# Patient Record
Sex: Female | Born: 1970 | Race: White | Hispanic: No | Marital: Single | Smoking: Never smoker
Health system: Southern US, Community
[De-identification: ages and names within clinical notes are randomized; demographics above are authoritative.]

## PROBLEM LIST (undated history)

## (undated) DIAGNOSIS — E119 Type 2 diabetes mellitus without complications: Secondary | ICD-10-CM

## (undated) HISTORY — PX: CHOLECYSTECTOMY: SHX55

## (undated) HISTORY — PX: OVARIAN CYST SURGERY: SHX726

---

## 1998-09-22 ENCOUNTER — Encounter: Payer: Self-pay | Admitting: *Deleted

## 1998-09-22 ENCOUNTER — Inpatient Hospital Stay (HOSPITAL_COMMUNITY): Admission: AD | Admit: 1998-09-22 | Discharge: 1998-09-22 | Payer: Self-pay | Admitting: *Deleted

## 2014-06-06 ENCOUNTER — Other Ambulatory Visit: Payer: Self-pay | Admitting: Orthopedic Surgery

## 2014-06-06 ENCOUNTER — Encounter (HOSPITAL_BASED_OUTPATIENT_CLINIC_OR_DEPARTMENT_OTHER): Payer: Self-pay | Admitting: *Deleted

## 2014-06-06 ENCOUNTER — Encounter (HOSPITAL_COMMUNITY): Payer: Self-pay | Admitting: Emergency Medicine

## 2014-06-06 ENCOUNTER — Emergency Department (HOSPITAL_COMMUNITY)
Admission: EM | Admit: 2014-06-06 | Discharge: 2014-06-06 | Disposition: A | Discharge status: Home / Self Care | Payer: BC Managed Care – PPO | Attending: Emergency Medicine | Admitting: Emergency Medicine

## 2014-06-06 ENCOUNTER — Emergency Department (HOSPITAL_COMMUNITY): Payer: BC Managed Care – PPO

## 2014-06-06 DIAGNOSIS — Y929 Unspecified place or not applicable: Secondary | ICD-10-CM | POA: Insufficient documentation

## 2014-06-06 DIAGNOSIS — S6990XA Unspecified injury of unspecified wrist, hand and finger(s), initial encounter: Secondary | ICD-10-CM | POA: Diagnosis present

## 2014-06-06 DIAGNOSIS — S59919A Unspecified injury of unspecified forearm, initial encounter: Secondary | ICD-10-CM

## 2014-06-06 DIAGNOSIS — S52599A Other fractures of lower end of unspecified radius, initial encounter for closed fracture: Secondary | ICD-10-CM | POA: Insufficient documentation

## 2014-06-06 DIAGNOSIS — W010XXA Fall on same level from slipping, tripping and stumbling without subsequent striking against object, initial encounter: Secondary | ICD-10-CM | POA: Diagnosis not present

## 2014-06-06 DIAGNOSIS — S59909A Unspecified injury of unspecified elbow, initial encounter: Secondary | ICD-10-CM | POA: Diagnosis present

## 2014-06-06 DIAGNOSIS — S52502A Unspecified fracture of the lower end of left radius, initial encounter for closed fracture: Secondary | ICD-10-CM

## 2014-06-06 DIAGNOSIS — Y939 Activity, unspecified: Secondary | ICD-10-CM | POA: Diagnosis not present

## 2014-06-06 MED ORDER — OXYCODONE-ACETAMINOPHEN 5-325 MG PO TABS
1.0000 | ORAL_TABLET | Freq: Once | ORAL | Status: AC
  Administered 2014-06-06: 1 via ORAL
  Filled 2014-06-06: qty 1

## 2014-06-06 MED ORDER — IBUPROFEN 600 MG PO TABS: 600.0000 mg | ORAL_TABLET | Freq: Four times a day (QID) | ORAL | Status: DC | PRN

## 2014-06-06 MED ORDER — HYDROCODONE-ACETAMINOPHEN 5-325 MG PO TABS: 1.0000 | ORAL_TABLET | Freq: Four times a day (QID) | ORAL | Status: DC | PRN

## 2014-06-06 MED ORDER — MORPHINE SULFATE 4 MG/ML IJ SOLN: 6.0000 mg | Freq: Once | INTRAMUSCULAR | Status: DC

## 2014-06-06 NOTE — ED Notes (Signed)
Ortho tech to bedside. 

## 2014-06-06 NOTE — ED Notes (Signed)
Pt declines pain meds at this time, instructed to call if pain increases

## 2014-06-06 NOTE — ED Notes (Signed)
Ortho tech at bedside 

## 2014-06-06 NOTE — ED Notes (Signed)
Pt. slipped and fell at home this morning , no LOC / ambulatory , presents with left wrist pain/swelling . Alert and oriented /respirations unlabored .

## 2014-06-06 NOTE — Progress Notes (Signed)
Orthopedic Tech Progress Note Patient Details:  Tamara Hodge November 20, 1970 [id number] Sugartong splint applied to LUE; tolerated well. Arm sling applied for comfort. Ortho Devices Type of Ortho Device: Ace wrap;Arm sling;Sugartong splint Ortho Device/Splint Location: LUE Ortho Device/Splint Interventions: Application   Asia R Thompson 06/06/2014, 9:20 AM

## 2014-06-06 NOTE — Discharge Instructions (Signed)
Keep wrist elevated. Ice several times a day. Ibuprofen for pain. norco for severe pain. Call and set up a follow up apt with Dr. Merlyn Lot in the office for next week. Return if any issues before that.   Wrist Fracture A wrist fracture is a break or crack in one of the bones of your wrist. Your wrist is made up of eight small bones at the palm of your hand (carpal bones) and two long bones that make up your forearm (radius and ulna).  CAUSES   A direct blow to the wrist.  Falling on an outstretched hand.  Trauma, such as a car accident or a fall. RISK FACTORS Risk factors for wrist fracture include:   Participating in contact and high-risk sports, such as skiing, biking, and ice skating.  Taking steroid medicines.  Smoking.  Being female.  Being Caucasian.  Drinking more than three alcoholic beverages per day.  Having low or lowered bone density (osteoporosis or osteopenia).  Age. Older adults have decreased bone density.  Women who have had menopause.  History of previous fractures. SIGNS AND SYMPTOMS Symptoms of wrist fractures include tenderness, bruising, and inflammation. Additionally, the wrist may hang in an odd position or appear deformed.  DIAGNOSIS Diagnosis may include:  Physical exam.  X-ray. TREATMENT Treatment depends on many factors, including the nature and location of the fracture, your age, and your activity level. Treatment for wrist fracture can be nonsurgical or surgical.  Nonsurgical Treatment A plaster cast or splint may be applied to your wrist if the bone is in a good position. If the fracture is not in good position, it may be necessary for your health care provider to realign it before applying a splint or cast. Usually, a cast or splint will be worn for several weeks.  Surgical Treatment Sometimes the position of the bone is so far out of place that surgery is required to apply a device to hold it together as it heals. Depending on the fracture,  there are a number of options for holding the bone in place while it heals, such as a cast and metal pins.  HOME CARE INSTRUCTIONS  Keep your injured wrist elevated and move your fingers as much as possible.  Do not put pressure on any part of your cast or splint. It may break.   Use a plastic bag to protect your cast or splint from water while bathing or showering. Do not lower your cast or splint into water.  Take medicines only as directed by your health care provider.  Keep your cast or splint clean and dry. If it becomes wet, damaged, or suddenly feels too tight, contact your health care provider right away.  Do not use any tobacco products including cigarettes, chewing tobacco, or electronic cigarettes. Tobacco can delay bone healing. If you need help quitting, ask your health care provider.  Keep all follow-up visits as directed by your health care provider. This is important.  Ask your health care provider if you should take supplements of calcium and vitamins C and D to promote bone healing. SEEK MEDICAL CARE IF:   Your cast or splint is damaged, breaks, or gets wet.  You have a fever.  You have chills.  You have continued severe pain or more swelling than you did before the cast was put on. SEEK IMMEDIATE MEDICAL CARE IF:   Your hand or fingernails on the injured arm turn blue or gray, or feel cold or numb.  You have decreased  feeling in the fingers of your injured arm. MAKE SURE YOU:  Understand these instructions.  Will watch your condition.  Will get help right away if you are not doing well or get worse. Document Released: 06/29/2005 Document Revised: 02/03/2014 Document Reviewed: 10/07/2011 [facility] Patient Information 2015 [place], [place]. This information is not intended to replace advice given to you by your health care provider. Make sure you discuss any questions you have with your health care provider.

## 2014-06-06 NOTE — ED Provider Notes (Signed)
CSN: [id number]     Arrival date & time 06/06/14  4098 History   First MD Initiated Contact with Patient 06/06/14 [phone]     Chief Complaint  Patient presents with  . Wrist Injury     (Consider location/radiation/quality/duration/timing/severity/associated sxs/prior Treatment) HPI Tamara Hodge is a 43 y.o. female who presents to ED with complaint of left wrist injury. Pt states she slipped on a tile floor and fell trying to catch herself with left hand. Pt reports pain and deformity to left wrist. No numbness or weakness to the hand. No prior wrist injuries. No tx at home prior to coming in. Denies any other injuries. Did not hit head, no LOC.   History reviewed. No pertinent past medical history. Past Surgical History  Procedure Laterality Date  . Cholecystectomy    . Ovarian cyst surgery     No family history on file. History  Substance Use Topics  . Smoking status: Never Smoker   . Smokeless tobacco: Not on file  . Alcohol Use: Yes   OB History   Grav Para Term Preterm Abortions TAB SAB Ect Mult Living                 Review of Systems  Constitutional: Negative for fever and chills.  Respiratory: Negative for cough, chest tightness and shortness of breath.   Cardiovascular: Negative for chest pain, palpitations and leg swelling.  Musculoskeletal: Positive for arthralgias and joint swelling. Negative for neck pain and neck stiffness.  Skin: Negative for rash.  Neurological: Negative for dizziness, weakness, numbness and headaches.  All other systems reviewed and are negative.     Allergies  Review of patient's allergies indicates no known allergies.  Home Medications   Prior to Admission medications   Not on File   BP 154/87  Pulse 83  Resp 18  Ht  (1.651 m)  Wt 265 lb (120.203 kg)  BMI 44.10 kg/m2  SpO2 98%  LMP 05/28/2014 Physical Exam  Nursing note and vitals reviewed. Constitutional: She is oriented to person, place, and time. She appears  well-developed and well-nourished. No distress.  Eyes: Conjunctivae are normal.  Neck: Neck supple.  Musculoskeletal:  Swelling noted to the radial aspect of left wrist. Tender to palpation over radial wrist. Unable to move wrist due to pain. Normal hand. Normal sensation of all fingers. Able to move all fingers. Cap refill <2 sec distally.  Neurological: She is alert and oriented to person, place, and time.  Skin: Skin is warm and dry.    ED Course  Procedures (including critical care time) Labs Review Labs Reviewed - No data to display  Imaging Review Dg Wrist Complete Left  06/06/2014   CLINICAL DATA:  Fall, wrist injury.  EXAM: LEFT WRIST - COMPLETE 3+ VIEW  COMPARISON:  None.  FINDINGS: There is an intra-articular mildly comminuted fracture through the distal left radius. Minimal displacement. No ulnar fracture visualized. Overlying soft tissue swelling.  IMPRESSION: Comminuted intra-articular distal left radial fracture.   Electronically Signed   By: Charlett Nose M.D.   On: 06/06/2014 07:10     EKG Interpretation None      MDM   Final diagnoses:  Distal radius fracture, left, closed, initial encounter    8:29 AM Pt with distral radial fracture, comminuted, intra articular. Neurovascularly intact.   Spoke with Dr. Merlyn Lot. Advised to splint, follow up with him in the office.   Filed Vitals:   06/06/14 4782 06/06/14 0715 06/06/14 9562  BP: 154/87 113/49 144/61  Pulse: 83 73 84  Resp: Height:  (1.651 m)    Weight: 265 lb (120.203 kg)    SpO2: 98% 99% 97%     Arren Laminack A Trinetta Alemu, PA-C 06/06/14 1250

## 2014-06-06 NOTE — ED Notes (Signed)
Patient transported to X-ray 

## 2014-06-10 ENCOUNTER — Ambulatory Visit (HOSPITAL_BASED_OUTPATIENT_CLINIC_OR_DEPARTMENT_OTHER): Payer: BC Managed Care – PPO | Admitting: Anesthesiology

## 2014-06-10 ENCOUNTER — Encounter (HOSPITAL_BASED_OUTPATIENT_CLINIC_OR_DEPARTMENT_OTHER)
Admission: RE | Disposition: A | Discharge status: Home / Self Care | Payer: Self-pay | Source: Ambulatory Visit | Attending: Orthopedic Surgery

## 2014-06-10 ENCOUNTER — Ambulatory Visit (HOSPITAL_BASED_OUTPATIENT_CLINIC_OR_DEPARTMENT_OTHER)
Admission: RE | Admit: 2014-06-10 | Discharge: 2014-06-10 | Disposition: A | Discharge status: Home / Self Care | Payer: BC Managed Care – PPO | Source: Ambulatory Visit | Attending: Orthopedic Surgery | Admitting: Orthopedic Surgery

## 2014-06-10 ENCOUNTER — Encounter (HOSPITAL_BASED_OUTPATIENT_CLINIC_OR_DEPARTMENT_OTHER): Payer: Self-pay | Admitting: *Deleted

## 2014-06-10 ENCOUNTER — Encounter (HOSPITAL_BASED_OUTPATIENT_CLINIC_OR_DEPARTMENT_OTHER): Payer: BC Managed Care – PPO | Admitting: Anesthesiology

## 2014-06-10 DIAGNOSIS — Y93E1 Activity, personal bathing and showering: Secondary | ICD-10-CM | POA: Insufficient documentation

## 2014-06-10 DIAGNOSIS — Y92009 Unspecified place in unspecified non-institutional (private) residence as the place of occurrence of the external cause: Secondary | ICD-10-CM | POA: Insufficient documentation

## 2014-06-10 DIAGNOSIS — S52599A Other fractures of lower end of unspecified radius, initial encounter for closed fracture: Secondary | ICD-10-CM | POA: Diagnosis not present

## 2014-06-10 DIAGNOSIS — Y998 Other external cause status: Secondary | ICD-10-CM | POA: Diagnosis not present

## 2014-06-10 DIAGNOSIS — W010XXA Fall on same level from slipping, tripping and stumbling without subsequent striking against object, initial encounter: Secondary | ICD-10-CM | POA: Insufficient documentation

## 2014-06-10 HISTORY — PX: OPEN REDUCTION INTERNAL FIXATION (ORIF) DISTAL RADIAL FRACTURE: SHX5989

## 2014-06-10 LAB — POCT HEMOGLOBIN-HEMACUE: Hemoglobin: 13.9 g/dL (ref 12.0–15.0)

## 2014-06-10 SURGERY — OPEN REDUCTION INTERNAL FIXATION (ORIF) DISTAL RADIUS FRACTURE
Anesthesia: Regional | Site: Wrist | Laterality: Left

## 2014-06-10 MED ORDER — BUPIVACAINE-EPINEPHRINE (PF) 0.5% -1:200000 IJ SOLN
INTRAMUSCULAR | Status: DC | PRN
  Administered 2014-06-10: 30 mL via PERINEURAL

## 2014-06-10 MED ORDER — KETOROLAC TROMETHAMINE 30 MG/ML IJ SOLN
INTRAMUSCULAR | Status: AC
  Filled 2014-06-10: qty 1

## 2014-06-10 MED ORDER — KETOROLAC TROMETHAMINE 30 MG/ML IJ SOLN
30.0000 mg | Freq: Once | INTRAMUSCULAR | Status: AC
  Administered 2014-06-10: 30 mg via INTRAVENOUS

## 2014-06-10 MED ORDER — FENTANYL CITRATE 0.05 MG/ML IJ SOLN
INTRAMUSCULAR | Status: AC
  Filled 2014-06-10: qty 2

## 2014-06-10 MED ORDER — FENTANYL CITRATE 0.05 MG/ML IJ SOLN
INTRAMUSCULAR | Status: DC | PRN
  Administered 2014-06-10 (×2): 50 ug via INTRAVENOUS

## 2014-06-10 MED ORDER — CHLORHEXIDINE GLUCONATE 4 % EX LIQD: 60.0000 mL | Freq: Once | CUTANEOUS | Status: DC

## 2014-06-10 MED ORDER — FENTANYL CITRATE 0.05 MG/ML IJ SOLN
50.0000 ug | INTRAMUSCULAR | Status: DC | PRN
  Administered 2014-06-10: 100 ug via INTRAVENOUS

## 2014-06-10 MED ORDER — LACTATED RINGERS IV SOLN
INTRAVENOUS | Status: DC
  Administered 2014-06-10: 13:00:00 via INTRAVENOUS
  Administered 2014-06-10: 10 mL/h via INTRAVENOUS

## 2014-06-10 MED ORDER — PROPOFOL 10 MG/ML IV BOLUS
INTRAVENOUS | Status: DC | PRN
  Administered 2014-06-10: 200 mg via INTRAVENOUS

## 2014-06-10 MED ORDER — MIDAZOLAM HCL 2 MG/2ML IJ SOLN
INTRAMUSCULAR | Status: AC
  Filled 2014-06-10: qty 2

## 2014-06-10 MED ORDER — CEFAZOLIN SODIUM-DEXTROSE 2-3 GM-% IV SOLR
INTRAVENOUS | Status: AC
  Filled 2014-06-10: qty 50

## 2014-06-10 MED ORDER — DEXAMETHASONE SODIUM PHOSPHATE 4 MG/ML IJ SOLN
INTRAMUSCULAR | Status: DC | PRN
  Administered 2014-06-10: 8 mg via INTRAVENOUS

## 2014-06-10 MED ORDER — METOCLOPRAMIDE HCL 5 MG/ML IJ SOLN
INTRAMUSCULAR | Status: DC | PRN
  Administered 2014-06-10: 10 mg via INTRAVENOUS

## 2014-06-10 MED ORDER — DEXTROSE 5 % IV SOLN
3.0000 g | INTRAVENOUS | Status: AC
  Administered 2014-06-10: 3 g via INTRAVENOUS

## 2014-06-10 MED ORDER — MIDAZOLAM HCL 2 MG/2ML IJ SOLN
1.0000 mg | INTRAMUSCULAR | Status: DC | PRN
  Administered 2014-06-10: 2 mg via INTRAVENOUS

## 2014-06-10 MED ORDER — LIDOCAINE HCL (CARDIAC) 20 MG/ML IV SOLN
INTRAVENOUS | Status: DC | PRN
  Administered 2014-06-10: 100 mg via INTRAVENOUS

## 2014-06-10 MED ORDER — FENTANYL CITRATE 0.05 MG/ML IJ SOLN
INTRAMUSCULAR | Status: AC
  Filled 2014-06-10: qty 4

## 2014-06-10 MED ORDER — OXYCODONE-ACETAMINOPHEN 5-325 MG PO TABS: ORAL_TABLET | ORAL | Status: DC

## 2014-06-10 MED ORDER — CEFAZOLIN SODIUM 1-5 GM-% IV SOLN
INTRAVENOUS | Status: AC
  Filled 2014-06-10: qty 50

## 2014-06-10 MED ORDER — MIDAZOLAM HCL 5 MG/5ML IJ SOLN
INTRAMUSCULAR | Status: DC | PRN
  Administered 2014-06-10: 2 mg via INTRAVENOUS

## 2014-06-10 MED ORDER — ONDANSETRON HCL 4 MG/2ML IJ SOLN
INTRAMUSCULAR | Status: DC | PRN
  Administered 2014-06-10: 4 mg via INTRAVENOUS

## 2014-06-10 SURGICAL SUPPLY — 67 items
BANDAGE ELASTIC 3 VELCRO ST LF (GAUZE/BANDAGES/DRESSINGS) ×3 IMPLANT
BIT DRILL 2.0 LNG QUCK RELEASE (BIT) ×1 IMPLANT
BIT DRILL 2.8 QUICK RELEASE (BIT) ×1 IMPLANT
BLADE MINI RND TIP GREEN BEAV (BLADE) IMPLANT
BLADE SURG 15 STRL LF DISP TIS (BLADE) ×2 IMPLANT
BLADE SURG 15 STRL SS (BLADE) ×4
BNDG ESMARK 4X9 LF (GAUZE/BANDAGES/DRESSINGS) ×3 IMPLANT
BNDG GAUZE ELAST 4 BULKY (GAUZE/BANDAGES/DRESSINGS) ×3 IMPLANT
CHLORAPREP W/TINT 26ML (MISCELLANEOUS) ×3 IMPLANT
CORDS BIPOLAR (ELECTRODE) ×3 IMPLANT
COVER MAYO STAND STRL (DRAPES) ×3 IMPLANT
COVER TABLE BACK 60X90 (DRAPES) ×3 IMPLANT
DRAPE EXTREMITY T 121X128X90 (DRAPE) ×3 IMPLANT
DRAPE OEC MINIVIEW 54X84 (DRAPES) ×3 IMPLANT
DRAPE SURG 17X23 STRL (DRAPES) ×3 IMPLANT
DRILL 2.0 LNG QUICK RELEASE (BIT) ×3
DRILL 2.8 QUICK RELEASE (BIT) ×3
GAUZE SPONGE 4X4 12PLY STRL (GAUZE/BANDAGES/DRESSINGS) ×3 IMPLANT
GAUZE XEROFORM 1X8 LF (GAUZE/BANDAGES/DRESSINGS) ×3 IMPLANT
GLOVE BIO SURGEON STRL SZ7.5 (GLOVE) ×3 IMPLANT
GLOVE BIOGEL PI IND STRL 7.0 (GLOVE) ×1 IMPLANT
GLOVE BIOGEL PI IND STRL 8 (GLOVE) ×1 IMPLANT
GLOVE BIOGEL PI IND STRL 8.5 (GLOVE) ×1 IMPLANT
GLOVE BIOGEL PI INDICATOR 7.0 (GLOVE) ×2
GLOVE BIOGEL PI INDICATOR 8 (GLOVE) ×2
GLOVE BIOGEL PI INDICATOR 8.5 (GLOVE) ×2
GLOVE ECLIPSE 6.5 STRL STRAW (GLOVE) ×3 IMPLANT
GLOVE EXAM NITRILE LRG STRL (GLOVE) ×3 IMPLANT
GLOVE SURG ORTHO 8.0 STRL STRW (GLOVE) ×3 IMPLANT
GOWN STRL REUS W/ TWL LRG LVL3 (GOWN DISPOSABLE) ×1 IMPLANT
GOWN STRL REUS W/TWL LRG LVL3 (GOWN DISPOSABLE) ×2
GOWN STRL REUS W/TWL XL LVL3 (GOWN DISPOSABLE) ×6 IMPLANT
GUIDEWIRE ORTHO 0.054X6 (WIRE) ×9 IMPLANT
NEEDLE HYPO 25X1 1.5 SAFETY (NEEDLE) IMPLANT
NS IRRIG 1000ML POUR BTL (IV SOLUTION) ×3 IMPLANT
PACK BASIN DAY SURGERY FS (CUSTOM PROCEDURE TRAY) ×3 IMPLANT
PAD CAST 3X4 CTTN HI CHSV (CAST SUPPLIES) ×1 IMPLANT
PADDING CAST ABS 4INX4YD NS (CAST SUPPLIES)
PADDING CAST ABS COTTON 4X4 ST (CAST SUPPLIES) IMPLANT
PADDING CAST COTTON 3X4 STRL (CAST SUPPLIES) ×2
PLATE ACULOC 2 VDR STD LT (Plate) ×3 IMPLANT
SCREW CORT FT 20X2.3XLCK HEX (Screw) ×1 IMPLANT
SCREW CORT FT 22X2.3XLCK HEX (Screw) ×1 IMPLANT
SCREW CORTICAL LOCKING 2.3X18M (Screw) ×8 IMPLANT
SCREW CORTICAL LOCKING 2.3X20M (Screw) ×4 IMPLANT
SCREW CORTICAL LOCKING 2.3X22M (Screw) ×2 IMPLANT
SCREW FX18X2.3XSMTH LCK NS CRT (Screw) ×4 IMPLANT
SCREW FX20X2.3XSMTH LCK NS CRT (Screw) ×1 IMPLANT
SCREW NON LOCK 3.5X10MM (Screw) ×3 IMPLANT
SCREW NONLOCK HEX 3.5X12 (Screw) ×6 IMPLANT
SLEEVE SCD COMPRESS KNEE MED (MISCELLANEOUS) ×3 IMPLANT
SPLINT PLASTER CAST XFAST 4X15 (CAST SUPPLIES) ×10 IMPLANT
SPLINT PLASTER XTRA FAST SET 4 (CAST SUPPLIES) ×20
STOCKINETTE 4X48 STRL (DRAPES) ×3 IMPLANT
SUCTION FRAZIER TIP 10 FR DISP (SUCTIONS) IMPLANT
SUT ETHILON 3 0 PS 1 (SUTURE) IMPLANT
SUT ETHILON 4 0 PS 2 18 (SUTURE) ×3 IMPLANT
SUT VIC AB 3-0 PS1 18 (SUTURE)
SUT VIC AB 3-0 PS1 18XBRD (SUTURE) IMPLANT
SUT VICRYL 4-0 PS2 18IN ABS (SUTURE) ×3 IMPLANT
SYR BULB 3OZ (MISCELLANEOUS) ×3 IMPLANT
SYR CONTROL 10ML LL (SYRINGE) IMPLANT
TOWEL OR 17X24 6PK STRL BLUE (TOWEL DISPOSABLE) ×3 IMPLANT
TOWEL OR NON WOVEN STRL DISP B (DISPOSABLE) ×3 IMPLANT
TUBE CONNECTING 20'X1/4 (TUBING)
TUBE CONNECTING 20X1/4 (TUBING) IMPLANT
UNDERPAD 30X30 INCONTINENT (UNDERPADS AND DIAPERS) IMPLANT

## 2014-06-10 NOTE — ED Provider Notes (Signed)
Medical screening examination/treatment/procedure(s) were performed by non-physician practitioner and as supervising physician I was immediately available for consultation/collaboration.   EKG Interpretation None       Olivia Mackie, MD 06/10/14 [phone]

## 2014-06-10 NOTE — Brief Op Note (Signed)
06/10/2014  2:49 PM  PATIENT:  Tamara Hodge  43 y.o. female  PRE-OPERATIVE DIAGNOSIS:  left distal radius fracture  POST-OPERATIVE DIAGNOSIS:  left distal radius fracture  PROCEDURE:  Procedure(s): OPEN REDUCTION INTERNAL FIXATION (ORIF) LEFT DISTAL RADIAL FRACTURE (Left)  SURGEON:  Surgeon(s) and Role:    * Betha Loa, MD - Primary    * Cindee Salt, MD - Assisting  PHYSICIAN ASSISTANT:   ASSISTANTS: Cindee Salt, MD   ANESTHESIA:   regional and general  EBL:  Total I/O In: 800 [I.V.:800] Out: -   BLOOD ADMINISTERED:none  DRAINS: none   LOCAL MEDICATIONS USED:  NONE  SPECIMEN:  No Specimen  DISPOSITION OF SPECIMEN:  N/A  COUNTS:  YES  TOURNIQUET:   Total Tourniquet Time Documented: Upper Arm (Left) - 45 minutes Total: Upper Arm (Left) - 45 minutes   DICTATION: .Other Dictation: Dictation Number [id number]  PLAN OF CARE: Discharge to home after PACU  PATIENT DISPOSITION:  PACU - hemodynamically stable.

## 2014-06-10 NOTE — Anesthesia Procedure Notes (Addendum)
Ken930-1[facility]30Marjory Lies [facility] [place] EXTTAG>[id number] TTAG>[facility]156/96/295242mBeverley [phone]14Marjory Lies 6342.5[facility]706/96/295220mBeverley [phone]56Marjory Lies 2342.5[facility]

## 2014-06-10 NOTE — H&P (Signed)
  Tamara Hodge is an 43 y.o. female.   Chief Complaint: left distal radius fracture HPI: 43 yo rhd female states she slid on a plastic bin lid in her bathroom 06/06/14 landing on left hand.  Seen at [facility] where XR revealed left distal radius fracture.  Splinted and followed up in office.  Reports no previous injury to wrist and no other injury at this time.  History reviewed. No pertinent past medical history.  Past Surgical History  Procedure Laterality Date  . Cholecystectomy    . Ovarian cyst surgery      History reviewed. No pertinent family history. Social History:  reports that she has never smoked. She does not have any smokeless tobacco history on file. She reports that she drinks alcohol. She reports that she does not use illicit drugs.  Allergies: No Known Allergies  No prescriptions prior to admission    No results found for this or any previous visit (from the past 48 hour(s)).  No results found.   A comprehensive review of systems was negative.  Height  (1.651 m), weight 120.203 kg (265 lb), last menstrual period 05/28/2014.  General appearance: alert, cooperative and appears stated age Head: Normocephalic, without obvious abnormality, atraumatic Neck: supple, symmetrical, trachea midline Resp: clear to auscultation bilaterally Cardio: regular rate and rhythm GI: non tender Extremities: intact sensation and capillary refill all digits.  +epl/fpl/io.  ttp left wrist.  no wounds.   Pulses: 2+ and symmetric Skin: Skin color, texture, turgor normal. No rashes or lesions Neurologic: Grossly normal Incision/Wound: none  Assessment/Plan Left comminuted intraarticular distal radius fracture.  Non operative and operative treatment options were discussed with the patient and patient wishes to proceed with operative treatment. Recommend OR for ORIF.  Risks, benefits, and alternatives of surgery were discussed and the patient agrees with the plan of  care.   Ami Thornsberry R 06/10/2014, 9:09 AM

## 2014-06-10 NOTE — Anesthesia Preprocedure Evaluation (Signed)
Anesthesia Evaluation  Patient identified by MRN, date of birth, ID band Patient awake    Reviewed: Allergy & Precautions, H&P , NPO status , Patient's Chart, lab work & pertinent test results  Airway Mallampati: II  Neck ROM: full    Dental   Pulmonary neg pulmonary ROS,          Cardiovascular negative cardio ROS      Neuro/Psych    GI/Hepatic   Endo/Other  Morbid obesity  Renal/GU      Musculoskeletal   Abdominal   Peds  Hematology   Anesthesia Other Findings   Reproductive/Obstetrics                           Anesthesia Physical Anesthesia Plan  ASA: II  Anesthesia Plan: General and Regional   Post-op Pain Management:    Induction: Intravenous  Airway Management Planned: LMA  Additional Equipment:   Intra-op Plan:   Post-operative Plan:   Informed Consent: I have reviewed the patients History and Physical, chart, labs and discussed the procedure including the risks, benefits and alternatives for the proposed anesthesia with the patient or authorized representative who has indicated his/her understanding and acceptance.     Plan Discussed with: CRNA, Anesthesiologist and Surgeon  Anesthesia Plan Comments:         Anesthesia Quick Evaluation

## 2014-06-10 NOTE — Anesthesia Postprocedure Evaluation (Signed)
Anesthesia Post Note  Patient: Tamara Hodge  Procedure(s) Performed: Procedure(s) (LRB): OPEN REDUCTION INTERNAL FIXATION (ORIF) LEFT DISTAL RADIAL FRACTURE (Left)  Anesthesia type: General  Patient location: PACU  Post pain: Pain level controlled and Adequate analgesia  Post assessment: Post-op Vital signs reviewed, Patient's Cardiovascular Status Stable, Respiratory Function Stable, Patent Airway and Pain level controlled  Last Vitals:  Filed Vitals:   06/10/14 1600  BP: 124/86  Pulse: 90  Temp:   Resp: 15    Post vital signs: Reviewed and stable  Level of consciousness: awake, alert  and oriented  Complications: No apparent anesthesia complications

## 2014-06-10 NOTE — Progress Notes (Signed)
Assisted Dr. Hodierne with left, ultrasound guided, supraclavicular block. Side rails up, monitors on throughout procedure. See vital signs in flow sheet. Tolerated Procedure well. 

## 2014-06-10 NOTE — Op Note (Signed)
[id number] 

## 2014-06-10 NOTE — Discharge Instructions (Addendum)
[facility] Instructions °Hand Surgery ° °Wound Care: °Keep your hand elevated above the level of your heart.  Do not allow it to dangle by your side.  Keep the dressing dry and do not remove it unless your doctor advises you to do so.  He will usually change it at the time of your post-op visit.  Moving your fingers is advised to stimulate circulation but will depend on the site of your surgery.  If you have a splint applied, your doctor will advise you regarding movement. ° °Activity: °Do not drive or operate machinery today.  Rest today and then you may return to your normal activity and work as indicated by your physician. ° °Diet:  °Drink liquids today or eat a light diet.  You may resume a regular diet tomorrow.   ° °General expectations: °Pain for two to three days. °Fingers may become slightly swollen. ° °Call your doctor if any of the following occur: °Severe pain not relieved by pain medication. °Elevated temperature. °Dressing soaked with blood. °Inability to move fingers. °White or bluish color to fingers. ° ° °Regional Anesthesia Blocks ° °1. Numbness or the inability to move the "blocked" extremity may last from 3-48 hours after placement. The length of time depends on the medication injected and your individual response to the medication. If the numbness is not going away after 48 hours, call your surgeon. ° °2. The extremity that is blocked will need to be protected until the numbness is gone and the  Strength has returned. Because you cannot feel it, you will need to take extra care to avoid injury. Because it may be weak, you may have difficulty moving it or using it. You may not know what position it is in without looking at it while the block is in effect. ° °3. For blocks in the legs and feet, returning to weight bearing and walking needs to be done carefully. You will need to wait until the numbness is entirely gone and the strength has returned. You should be able to move your leg and foot  normally before you try and bear weight or walk. You will need someone to be with you when you first try to ensure you do not fall and possibly risk injury. ° °4. Bruising and tenderness at the needle site are common side effects and will resolve in a few days. ° °5. Persistent numbness or new problems with movement should be communicated to the surgeon or the [facility] ([phone])/ [facility] ([phone]). ° ° °Post Anesthesia Home Care Instructions ° °Activity: °Get plenty of rest for the remainder of the day. A responsible adult should stay with you for 24 hours following the procedure.  °For the next 24 hours, DO NOT: °-Drive a car °-Operate machinery °-Drink alcoholic beverages °-Take any medication unless instructed by your physician °-Make any legal decisions or sign important papers. ° °Meals: °Start with liquid foods such as gelatin or soup. Progress to regular foods as tolerated. Avoid greasy, spicy, heavy foods. If nausea and/or vomiting occur, drink only clear liquids until the nausea and/or vomiting subsides. Call your physician if vomiting continues. ° °Special Instructions/Symptoms: °Your throat may feel dry or sore from the anesthesia or the breathing tube placed in your throat during surgery. If this causes discomfort, gargle with warm salt water. The discomfort should disappear within 24 hours. ° °

## 2014-06-10 NOTE — Op Note (Signed)
Intra-operative fluoroscopic images in the AP, lateral, and oblique views were taken and evaluated by myself.  Reduction and hardware placement were confirmed.  There was no intraarticular penetration of permanent hardware.  

## 2014-06-10 NOTE — Transfer of Care (Signed)
Immediate Anesthesia Transfer of Care Note  Patient: Tamara Hodge  Procedure(s) Performed: Procedure(s): OPEN REDUCTION INTERNAL FIXATION (ORIF) LEFT DISTAL RADIAL FRACTURE (Left)  Patient Location: PACU  Anesthesia Type:GA combined with regional for post-op pain  Level of Consciousness: sedated  Airway & Oxygen Therapy: Patient Spontanous Breathing and Patient connected to face mask oxygen  Post-op Assessment: Report given to PACU RN and Post -op Vital signs reviewed and stable  Post vital signs: Reviewed and stable  Complications: No apparent anesthesia complications

## 2014-06-11 ENCOUNTER — Encounter (HOSPITAL_BASED_OUTPATIENT_CLINIC_OR_DEPARTMENT_OTHER): Payer: Self-pay | Admitting: Orthopedic Surgery

## 2014-06-11 NOTE — Op Note (Signed)
NAMEEVELEEN, MCNEAR NO.:  [id number]  MEDICAL RECORD NO.:  [id number]  LOCATION:                                 FACILITY:  PHYSICIAN:  Betha Loa, MD        DATE OF BIRTH:  [birth year]  DATE OF PROCEDURE:  06/10/2014 DATE OF DISCHARGE:                              OPERATIVE REPORT   PREOPERATIVE DIAGNOSIS:  Left distal radius comminuted intra-articular fracture.  POSTOPERATIVE DIAGNOSIS:  Left distal radius comminuted intra-articular fracture.  PROCEDURE:  Open reduction and internal fixation left comminuted intra- articular distal radius fracture.  SURGEON:  Betha Loa, MD  ASSISTANT:  Cindee Salt, MD.  ANESTHESIA:  General with regional.  IV FLUIDS:  Per anesthesia flow sheet.  ESTIMATED BLOOD LOSS:  Minimal.  COMPLICATIONS:  None.  SPECIMENS:  None.  TOURNIQUET TIME:  45 minutes.  DISPOSITION:  Stable to PACU.  INDICATIONS:  Ms. Impson is a 43 year old female who fell approximately 4 days ago injuring her left wrist.  She was seen at the emergency department where radiographs were taken revealing a comminuted intra- articular distal radius fracture.  She was referred to me for further followup.  We discussed nonoperative and operative treatment options. She wished to proceed with operative treatment.  Risks, benefits, and alternatives of surgery were discussed including risk of blood loss, infection, damage to nerves, vessels, tendons, ligaments, bone; failure of surgery; need for additional surgery, complications with wound healing, continued pain, nonunion, malunion, and stiffness.  She voiced understanding of these risks and elected to proceed.  OPERATIVE COURSE:  After being identified preoperatively by myself, the patient and I agreed upon procedure and site of procedure.  Surgical site was marked.  The risks, benefits, and alternatives of surgery were reviewed and she wished to proceed.  Surgical consent had been signed. She was  given IV Ancef as preoperative antibiotic prophylaxis.  A regional block was performed by Anesthesia Department in the preoperative holding room.  She was transported to the operating room and placed on the operating table in a supine position with the left upper extremity on arm board.  General anesthesia was induced by Anesthesiology.  Left upper extremity was prepped and draped in normal sterile orthopedic fashion.  Surgical pause was performed between surgeons, anesthesia, and operating room staff, and all were in agreement as to the patient, procedure, and site of procedure. Tourniquet at the proximal aspect of the extremity was inflated to 250 mmHg after exsanguination of the limb with an Esmarch bandage.  Standard volar Sherilyn Cooter approach was used.  Bipolar electrocautery was used in the subcutaneous tissues to aid in hemostasis.  The superficial and deep portions of the FCR tendon sheath were divided and the FCR and FPL swept ulnarly to protect the palmar cutaneous branch of the median nerve. Pronator quadratus was released at the radial side of the radius and elevated with a periosteal elevator.  The brachioradialis was released. The fracture site was easily identified.  It was reduced under direct visualization.  The Acumed volar distal radial locking plate was selected and secured to the bone using the guide pins.  It was adjusted until appropriate fit  had been achieved.  The C-arm was used in the AP, lateral, and oblique projections to aid in reduction and position of hardware.  A single screw was placed in the slotted hole in the shaft of the plate using standard AO drilling and measuring technique.  The distal screw holes were then filled with locking pegs with the exception of the styloid holes, which were filled with locking screws.  The remaining 2 holes in the shaft and plate were filled with nonlocking screws.  Good purchase was obtained in all, but the most distal  shaft hole.  C-arm was used in AP, lateral, and oblique projections to ensure appropriate reduction and position of hardware, which was the case. There was no intra-articular penetration.  The wound was copiously irrigated with sterile saline.  Pronator quadratus was repaired back over top of the plate using 4-0 Vicryl suture in a figure-of-eight fashion.  Inverted interrupted Vicryl sutures were placed in the subcutaneous tissues and the skin was closed with 4-0 nylon in a horizontal mattress fashion.  The wound was dressed with sterile Xeroform, 4x4s, and wrapped with a Kerlix bandage.  The forearm was placed through pronation, supination, and had good range of motion.  The distal radioulnar joint was stable throughout.  A volar splint was placed and wrapped with Kerlix and Ace bandage.  Tourniquet was deflated at 45 minutes.  Fingertips were pink with brisk capillary refill after deflation of tourniquet.  The operative drapes were broken down and the patient was awoken from anesthesia safely.  She was transferred back to the stretcher and taken to PACU in stable condition.  I will see her back in the office 1 week for postoperative followup.  I will give her Percocet 5/325 one to two p.o. q.6 hours p.r.n. pain, dispensed #40.     Betha Loa, MD     KK/MEDQ  D:  06/10/2014  T:  06/11/2014  Job:  [id number]

## 2016-08-23 ENCOUNTER — Other Ambulatory Visit (HOSPITAL_COMMUNITY)
Admission: RE | Admit: 2016-08-23 | Discharge: 2016-08-23 | Disposition: A | Discharge status: Home / Self Care | Payer: 59 | Source: Ambulatory Visit | Attending: Pediatrics | Admitting: Pediatrics

## 2016-08-23 ENCOUNTER — Other Ambulatory Visit: Payer: Self-pay

## 2016-08-23 DIAGNOSIS — Z01419 Encounter for gynecological examination (general) (routine) without abnormal findings: Secondary | ICD-10-CM | POA: Diagnosis present

## 2016-08-26 LAB — CYTOLOGY - PAP: Diagnosis: NEGATIVE

## 2016-08-31 ENCOUNTER — Other Ambulatory Visit: Payer: Self-pay | Admitting: Family

## 2016-08-31 DIAGNOSIS — Z1231 Encounter for screening mammogram for malignant neoplasm of breast: Secondary | ICD-10-CM

## 2016-11-28 DIAGNOSIS — E1165 Type 2 diabetes mellitus with hyperglycemia: Secondary | ICD-10-CM | POA: Diagnosis not present

## 2016-12-01 DIAGNOSIS — E119 Type 2 diabetes mellitus without complications: Secondary | ICD-10-CM | POA: Diagnosis not present

## 2017-05-21 DIAGNOSIS — L237 Allergic contact dermatitis due to plants, except food: Secondary | ICD-10-CM | POA: Diagnosis not present

## 2017-05-26 DIAGNOSIS — L237 Allergic contact dermatitis due to plants, except food: Secondary | ICD-10-CM | POA: Diagnosis not present

## 2017-06-02 DIAGNOSIS — Z23 Encounter for immunization: Secondary | ICD-10-CM | POA: Diagnosis not present

## 2017-06-02 DIAGNOSIS — E119 Type 2 diabetes mellitus without complications: Secondary | ICD-10-CM | POA: Diagnosis not present

## 2017-06-02 DIAGNOSIS — R030 Elevated blood-pressure reading, without diagnosis of hypertension: Secondary | ICD-10-CM | POA: Diagnosis not present

## 2017-08-30 DIAGNOSIS — Z7984 Long term (current) use of oral hypoglycemic drugs: Secondary | ICD-10-CM | POA: Diagnosis not present

## 2017-08-30 DIAGNOSIS — R809 Proteinuria, unspecified: Secondary | ICD-10-CM | POA: Diagnosis not present

## 2017-08-30 DIAGNOSIS — E1129 Type 2 diabetes mellitus with other diabetic kidney complication: Secondary | ICD-10-CM | POA: Diagnosis not present

## 2017-08-30 DIAGNOSIS — I10 Essential (primary) hypertension: Secondary | ICD-10-CM | POA: Diagnosis not present

## 2017-08-30 DIAGNOSIS — Z713 Dietary counseling and surveillance: Secondary | ICD-10-CM | POA: Diagnosis not present

## 2017-10-20 DIAGNOSIS — E119 Type 2 diabetes mellitus without complications: Secondary | ICD-10-CM | POA: Diagnosis not present

## 2017-10-20 DIAGNOSIS — H40033 Anatomical narrow angle, bilateral: Secondary | ICD-10-CM | POA: Diagnosis not present

## 2018-01-15 ENCOUNTER — Other Ambulatory Visit: Payer: Self-pay

## 2018-01-23 ENCOUNTER — Other Ambulatory Visit: Payer: Self-pay

## 2018-01-23 ENCOUNTER — Encounter (HOSPITAL_BASED_OUTPATIENT_CLINIC_OR_DEPARTMENT_OTHER): Payer: Self-pay | Admitting: Emergency Medicine

## 2018-01-23 ENCOUNTER — Emergency Department (HOSPITAL_BASED_OUTPATIENT_CLINIC_OR_DEPARTMENT_OTHER)
Admission: EM | Admit: 2018-01-23 | Discharge: 2018-01-23 | Disposition: A | Discharge status: Home / Self Care | Payer: 59 | Attending: Emergency Medicine | Admitting: Emergency Medicine

## 2018-01-23 ENCOUNTER — Emergency Department (HOSPITAL_BASED_OUTPATIENT_CLINIC_OR_DEPARTMENT_OTHER): Payer: 59

## 2018-01-23 DIAGNOSIS — R600 Localized edema: Secondary | ICD-10-CM | POA: Insufficient documentation

## 2018-01-23 DIAGNOSIS — R0789 Other chest pain: Secondary | ICD-10-CM | POA: Diagnosis not present

## 2018-01-23 DIAGNOSIS — R002 Palpitations: Secondary | ICD-10-CM | POA: Diagnosis not present

## 2018-01-23 DIAGNOSIS — R42 Dizziness and giddiness: Secondary | ICD-10-CM | POA: Insufficient documentation

## 2018-01-23 DIAGNOSIS — E119 Type 2 diabetes mellitus without complications: Secondary | ICD-10-CM | POA: Diagnosis not present

## 2018-01-23 DIAGNOSIS — R5383 Other fatigue: Secondary | ICD-10-CM | POA: Diagnosis not present

## 2018-01-23 DIAGNOSIS — R0602 Shortness of breath: Secondary | ICD-10-CM | POA: Diagnosis not present

## 2018-01-23 HISTORY — DX: Type 2 diabetes mellitus without complications: E11.9

## 2018-01-23 LAB — BASIC METABOLIC PANEL
Anion gap: 7 (ref 5–15)
BUN: 10 mg/dL (ref 6–20)
CO2: 23 mmol/L (ref 22–32)
Calcium: 8.5 mg/dL — ABNORMAL LOW (ref 8.9–10.3)
Chloride: 104 mmol/L (ref 101–111)
Creatinine, Ser: 0.81 mg/dL (ref 0.44–1.00)
GFR calc Af Amer: >60 mL/min (ref >60)
GLUCOSE: 133 mg/dL — AB (ref 65–99)
Potassium: 3.9 mmol/L (ref 3.5–5.1)
SODIUM: 134 mmol/L — AB (ref 135–145)

## 2018-01-23 LAB — CBC
HEMATOCRIT: 42.9 % (ref 36.0–46.0)
HEMOGLOBIN: 14.6 g/dL (ref 12.0–15.0)
MCH: 28.9 pg (ref 26.0–34.0)
MCHC: 34 g/dL (ref 30.0–36.0)
MCV: 84.8 fL (ref 78.0–100.0)
Platelets: 314 10*3/uL (ref 150–400)
RBC: 5.06 MIL/uL (ref 3.87–5.11)
RDW: 13.8 % (ref 11.5–15.5)
WBC: 10.6 10*3/uL — ABNORMAL HIGH (ref 4.0–10.5)

## 2018-01-23 LAB — D-DIMER, QUANTITATIVE: D-Dimer, Quant: <0.27 ug/mL-FEU (ref 0.00–0.50)

## 2018-01-23 LAB — MAGNESIUM: MAGNESIUM: 1.9 mg/dL (ref 1.7–2.4)

## 2018-01-23 LAB — BRAIN NATRIURETIC PEPTIDE: B Natriuretic Peptide: 17.8 pg/mL (ref 0.0–100.0)

## 2018-01-23 LAB — TROPONIN I: Troponin I: <0.03 ng/mL (ref <0.03)

## 2018-01-23 NOTE — Discharge Instructions (Signed)
Your work-up today was overall reassuring.  We suspect you are having symptom medic palpitations as the cause of her symptoms.  We did not find evidence of blood clot or heart injury or significant electrolyte abnormality.  We feel you are safe for discharge home.  Please follow-up with a cardiologist and your primary care physician for further evaluation and management.  If you have any new or worsened symptoms, please return to the nearest emergency department.

## 2018-01-23 NOTE — ED Triage Notes (Signed)
Palpitations since yesterday. Denies chest pain.

## 2018-01-23 NOTE — ED Provider Notes (Signed)
[facility] EMERGENCY DEPARTMENT Provider Note   CSN: [id number] Arrival date & time: 01/23/18  1809     History   Chief Complaint Chief Complaint  Patient presents with  . Palpitations    HPI Tamara Hodge is a 47 y.o. female.  The history is provided by the patient and medical records.  Palpitations   This is a new problem. The current episode started yesterday. The problem occurs daily. The problem has been resolved. Associated symptoms include malaise/fatigue, chest pain, lower extremity edema and shortness of breath. Pertinent negatives include no diaphoresis, no fever, no chest pressure, no exertional chest pressure, no irregular heartbeat, no orthopnea, no abdominal pain, no nausea, no vomiting, no headaches, no back pain, no dizziness, no weakness, no cough and no sputum production. She has tried nothing for the symptoms. The treatment provided no relief. Her past medical history does not include anemia, heart disease or hyperthyroidism.    Past Medical History:  Diagnosis Date  . Diabetes mellitus without complication (HCC)     There are no active problems to display for this patient.   Past Surgical History:  Procedure Laterality Date  . CHOLECYSTECTOMY    . OPEN REDUCTION INTERNAL FIXATION (ORIF) DISTAL RADIAL FRACTURE Left 06/10/2014   Procedure: OPEN REDUCTION INTERNAL FIXATION (ORIF) LEFT DISTAL RADIAL FRACTURE;  Surgeon: Betha Loa, MD;  Location: [facility];  Service: Orthopedics;  Laterality: Left;  . OVARIAN CYST SURGERY       OB History   None      Home Medications    Prior to Admission medications   Medication Sig Start Date End Date Taking? Authorizing Provider  ibuprofen (ADVIL,MOTRIN) 600 MG tablet Take 1 tablet (600 mg total) by mouth every 6 (six) hours as needed. 06/06/14   Kirichenko, Lemont Fillers, PA-C  oxyCODONE-acetaminophen (PERCOCET) 5-325 MG per tablet 1-2 tabs po q6 hours prn pain 06/10/14   Betha Loa, MD     Family History No family history on file.  Social History Social History   Tobacco Use  . Smoking status: Never Smoker  . Smokeless tobacco: Never Used  Substance Use Topics  . Alcohol use: Yes  . Drug use: No     Allergies   Patient has no known allergies.   Review of Systems Review of Systems  Constitutional: Positive for fatigue and malaise/fatigue. Negative for chills, diaphoresis and fever.  HENT: Negative for congestion.   Respiratory: Positive for chest tightness and shortness of breath. Negative for apnea, cough, sputum production and wheezing.   Cardiovascular: Positive for chest pain and palpitations. Negative for orthopnea.  Gastrointestinal: Negative for abdominal pain, constipation, diarrhea, nausea and vomiting.  Genitourinary: Negative for dysuria and flank pain.  Musculoskeletal: Negative for back pain, neck pain and neck stiffness.  Skin: Negative for rash and wound.  Neurological: Positive for light-headedness. Negative for dizziness, syncope, facial asymmetry, weakness and headaches.  Hematological: Negative for adenopathy.  All other systems reviewed and are negative.    Physical Exam Updated Vital Signs BP (!) 144/81 (BP Location: Right Arm)   Pulse 83   Temp 98.8 F (37.1 C) (Oral)   Resp 16   Ht 5\' 5"  (1.651 m)   Wt 120.2 kg (265 lb)   LMP 01/03/2018   SpO2 100%   BMI 44.10 kg/m   Physical Exam  Constitutional: She appears well-developed and well-nourished. No distress.  HENT:  Head: Normocephalic and atraumatic.  Nose: Nose normal.  Mouth/Throat: Oropharynx is clear and moist.  No oropharyngeal exudate.  Eyes: Conjunctivae are normal.  Neck: Neck supple.  Cardiovascular: Normal rate, regular rhythm and intact distal pulses.  No murmur heard. Pulmonary/Chest: Effort normal and breath sounds normal. No respiratory distress. She has no wheezes. She has no rales. She exhibits tenderness.  Abdominal: Soft. There is no tenderness.   Musculoskeletal: She exhibits edema. She exhibits no tenderness.  Neurological: She is alert. No sensory deficit. She exhibits normal muscle tone.  Skin: Skin is warm and dry. Capillary refill takes less than 2 seconds. No rash noted. She is not diaphoretic. No erythema.  Psychiatric: She has a normal mood and affect.  Nursing note and vitals reviewed.    ED Treatments / Results  Labs (all labs ordered are listed, but only abnormal results are displayed) Labs Reviewed  BASIC METABOLIC PANEL - Abnormal; Notable for the following components:      Result Value   Sodium 134 (*)    Glucose, Bld 133 (*)    Calcium 8.5 (*)    All other components within normal limits  CBC - Abnormal; Notable for the following components:   WBC 10.6 (*)    All other components within normal limits  TROPONIN I  BRAIN NATRIURETIC PEPTIDE  D-DIMER, QUANTITATIVE (NOT AT [facility])  MAGNESIUM    EKG None  ED ECG REPORT   Date: 01/23/2018  Rate: 76  Rhythm: normal sinus rhythm  QRS Axis: normal  Intervals: normal  ST/T Wave abnormalities: normal  Conduction Disutrbances:none  Narrative Interpretation:   Old EKG Reviewed: none available  I have personally reviewed the EKG tracing and agree with the computerized printout as noted.   Radiology Dg Chest 2 View  Result Date: 01/23/2018 CLINICAL DATA:  Palpitation EXAM: CHEST - 2 VIEW COMPARISON:  None. FINDINGS: No acute airspace disease or pleural effusion. Normal heart size. No pneumothorax. IMPRESSION: No active cardiopulmonary disease. Electronically Signed   By: Jasmine Pang M.D.   On: 01/23/2018 18:47    Procedures Procedures (including critical care time)  Medications Ordered in ED Medications - No data to display   Initial Impression / Assessment and Plan / ED Course  I have reviewed the triage vital signs and the nursing notes.  Pertinent labs & imaging results that were available during my care of the patient were reviewed by me and  considered in my medical decision making (see chart for details).     Tamara Hodge is a 47 y.o. female with a past medical history significant for diabetes who presents with 2 days of intermittent palpitations, pleuritic chest tightness, shortness of breath, and fatigue.  Patient reports that she has had palpitations on and off for the last 2 days.  She reports it is a fast heart rate sensation.  She reports that she has been having some chest tightness going across her chest that is worsened when she tries to take a deep breath.  She feels that there is "something preventing me from taking a deep breath".  She denies recent chest trauma.  She does report taking a long trip to [place] 2 weeks ago.  She denies any unilateral leg swelling but does report some bilateral leg edema for the last week.  She denies any hemoptysis.  She denies any nausea, vomiting, conservation, diarrhea, or dysuria.  She does report some generalized fatigue.  She reports having lightheaded spells but denies any syncopal episodes.  Reports that rest improves it.  She denies any family history of early heart disease or  sudden cardiac death.  She denies any history of DVT or PE or any family history of thromboembolism.  On exam, lungs are clear.  Chest is tender across her chest.  Abdomen is nontender.  Patient has mild edema in both lower extremities.  No significant tenderness.  Patient has pulses in all extremities.  Patient's EKG showed no evidence of STEMI.  No evidence of arrhythmias, prolonged QTC, or QRS abnormality.  Patient could be having some symptomatic palpitations however, given the recent travel and the pleuritic tightness, patient will have a d-dimer added.  Patient will have a BNP given the bilateral leg edema.  Patient will also have troponin and chest x-ray to further evaluate.  Patient will be monitored on telemetry during work-up.  Anticipate reassessment after work-up.  Diagnostic testing was overall  reassuring.  Slight hypocalcemia.  Mild leukocytosis.  Troponin, BNP, and d-dimer were negative.  Next para patient had no episodes of palpitations or tachycardia during her ED stay.  No recurrence of her symptoms.    Given her stability for several hours and reassuring work-up, do not feel patient needs admission or further monitoring at this time.  Patient will follow-up with your PCP and a cardiologist to discuss event monitoring or a Holter monitor.  Patient agreed with plan of care and understood return precautions.  Patient had no other questions or concerns and was discharged in good condition.   Final Clinical Impressions(s) / ED Diagnoses   Final diagnoses:  Palpitations    ED Discharge Orders    None      Clinical Impression: 1. Palpitations     Disposition: Discharge  Condition: Good  I have discussed the results, Dx and Tx plan with the pt(& family if present). He/she/they expressed understanding and agree(s) with the plan. Discharge instructions discussed at great length. Strict return precautions discussed and pt &/or family have verbalized understanding of the instructions. No further questions at time of discharge.    New Prescriptions   No medications on file    Follow Up: Wilfrid LundBecker, Anna G, PA [address] [place] [place] [id number] [phone]     [facility] [address] [place] [phone]       Kenderick Kobler, Canary Brimhristopher J, MD 01/23/18 [phone]

## 2018-04-04 DIAGNOSIS — I10 Essential (primary) hypertension: Secondary | ICD-10-CM | POA: Diagnosis not present

## 2018-04-04 DIAGNOSIS — E1129 Type 2 diabetes mellitus with other diabetic kidney complication: Secondary | ICD-10-CM | POA: Diagnosis not present

## 2018-04-04 DIAGNOSIS — R809 Proteinuria, unspecified: Secondary | ICD-10-CM | POA: Diagnosis not present

## 2018-05-31 DIAGNOSIS — Z23 Encounter for immunization: Secondary | ICD-10-CM | POA: Diagnosis not present

## 2018-09-29 DIAGNOSIS — E119 Type 2 diabetes mellitus without complications: Secondary | ICD-10-CM | POA: Diagnosis not present

## 2018-09-29 DIAGNOSIS — H40033 Anatomical narrow angle, bilateral: Secondary | ICD-10-CM | POA: Diagnosis not present

## 2018-11-15 DIAGNOSIS — E1129 Type 2 diabetes mellitus with other diabetic kidney complication: Secondary | ICD-10-CM | POA: Diagnosis not present

## 2018-11-15 DIAGNOSIS — I10 Essential (primary) hypertension: Secondary | ICD-10-CM | POA: Diagnosis not present

## 2019-11-29 IMAGING — DX DG CHEST 2V
2 series · 2 of 2 positions shown · non-contrast
Comparison: None.

CLINICAL DATA: Palpitation

EXAM:
CHEST - 2 VIEW

[chest pa]
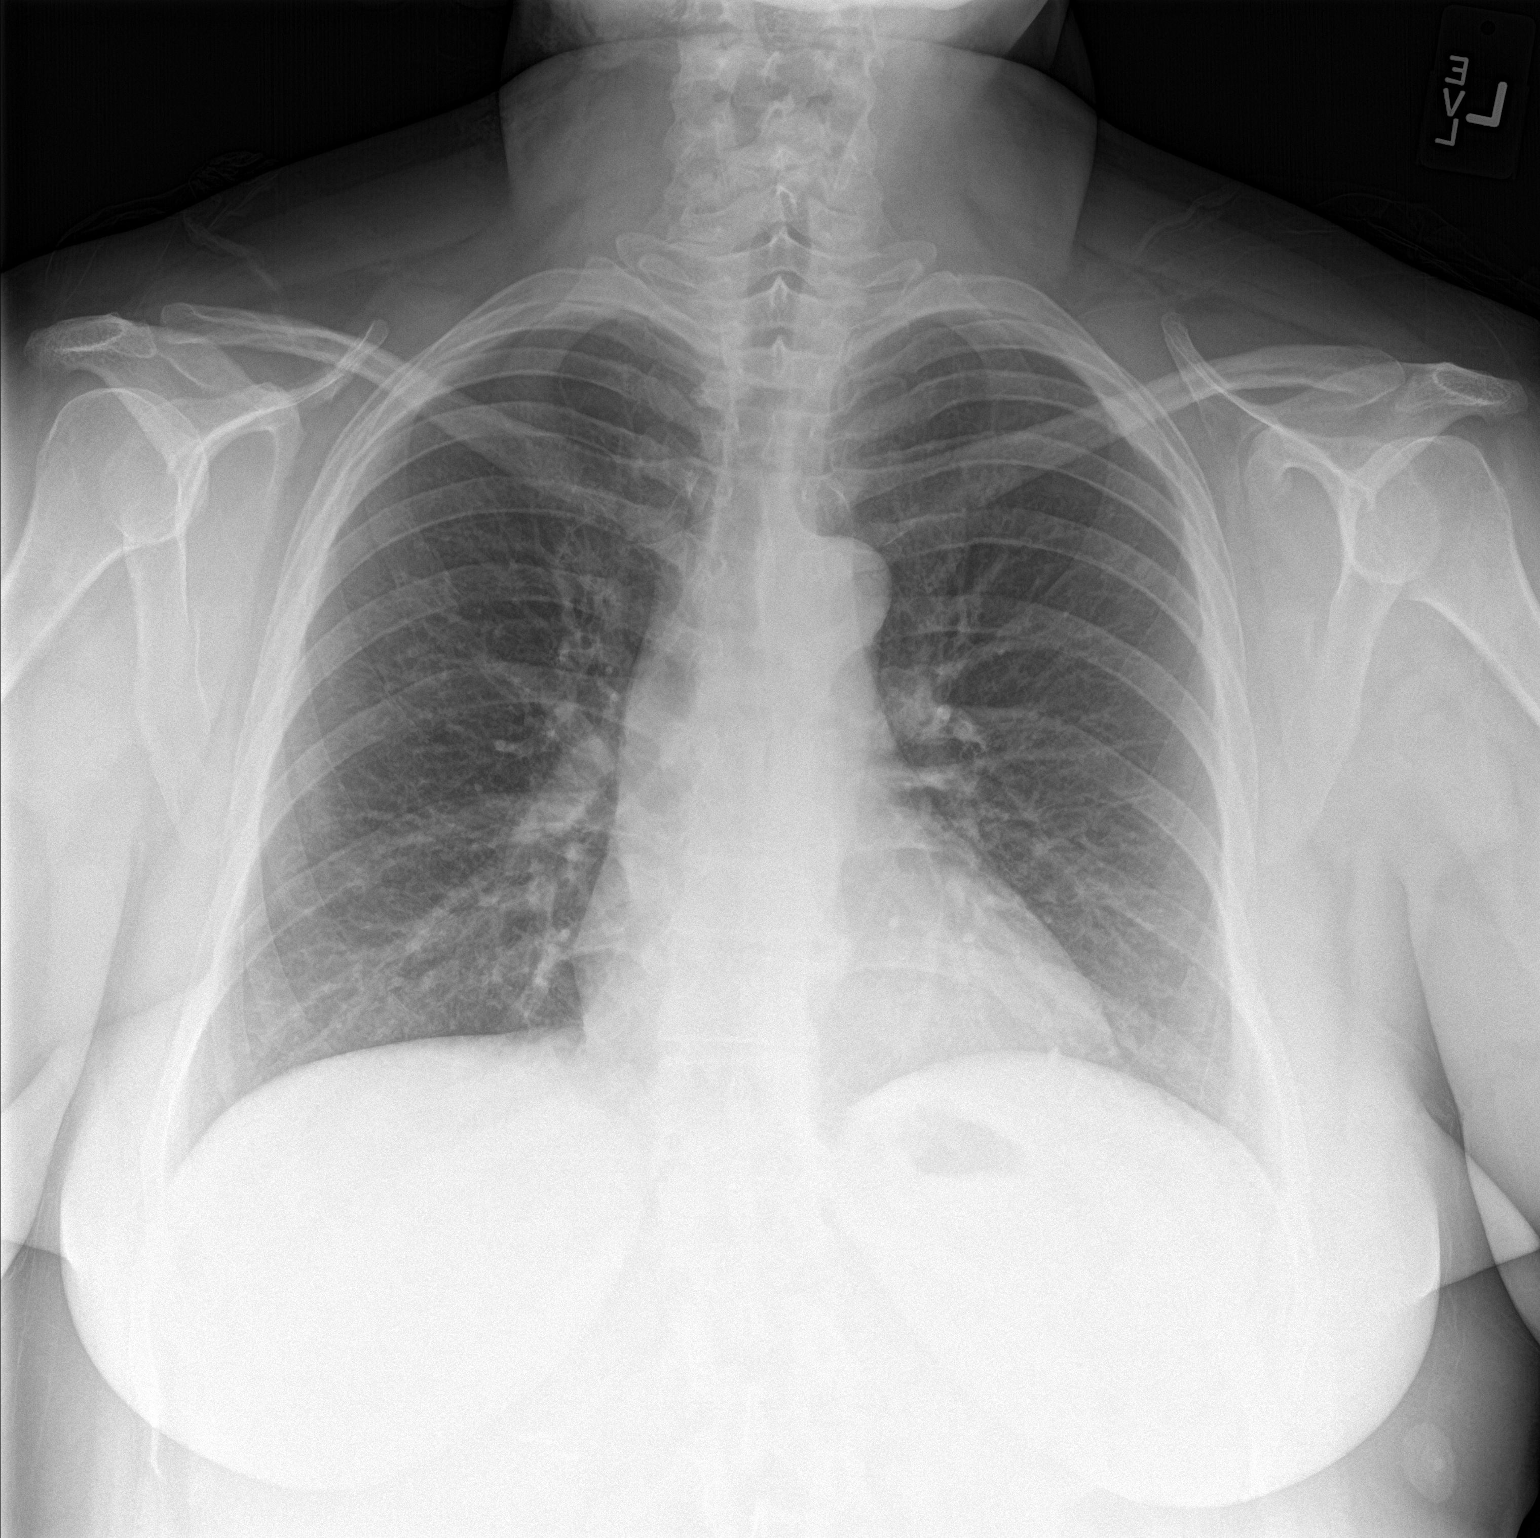

[chest lat]
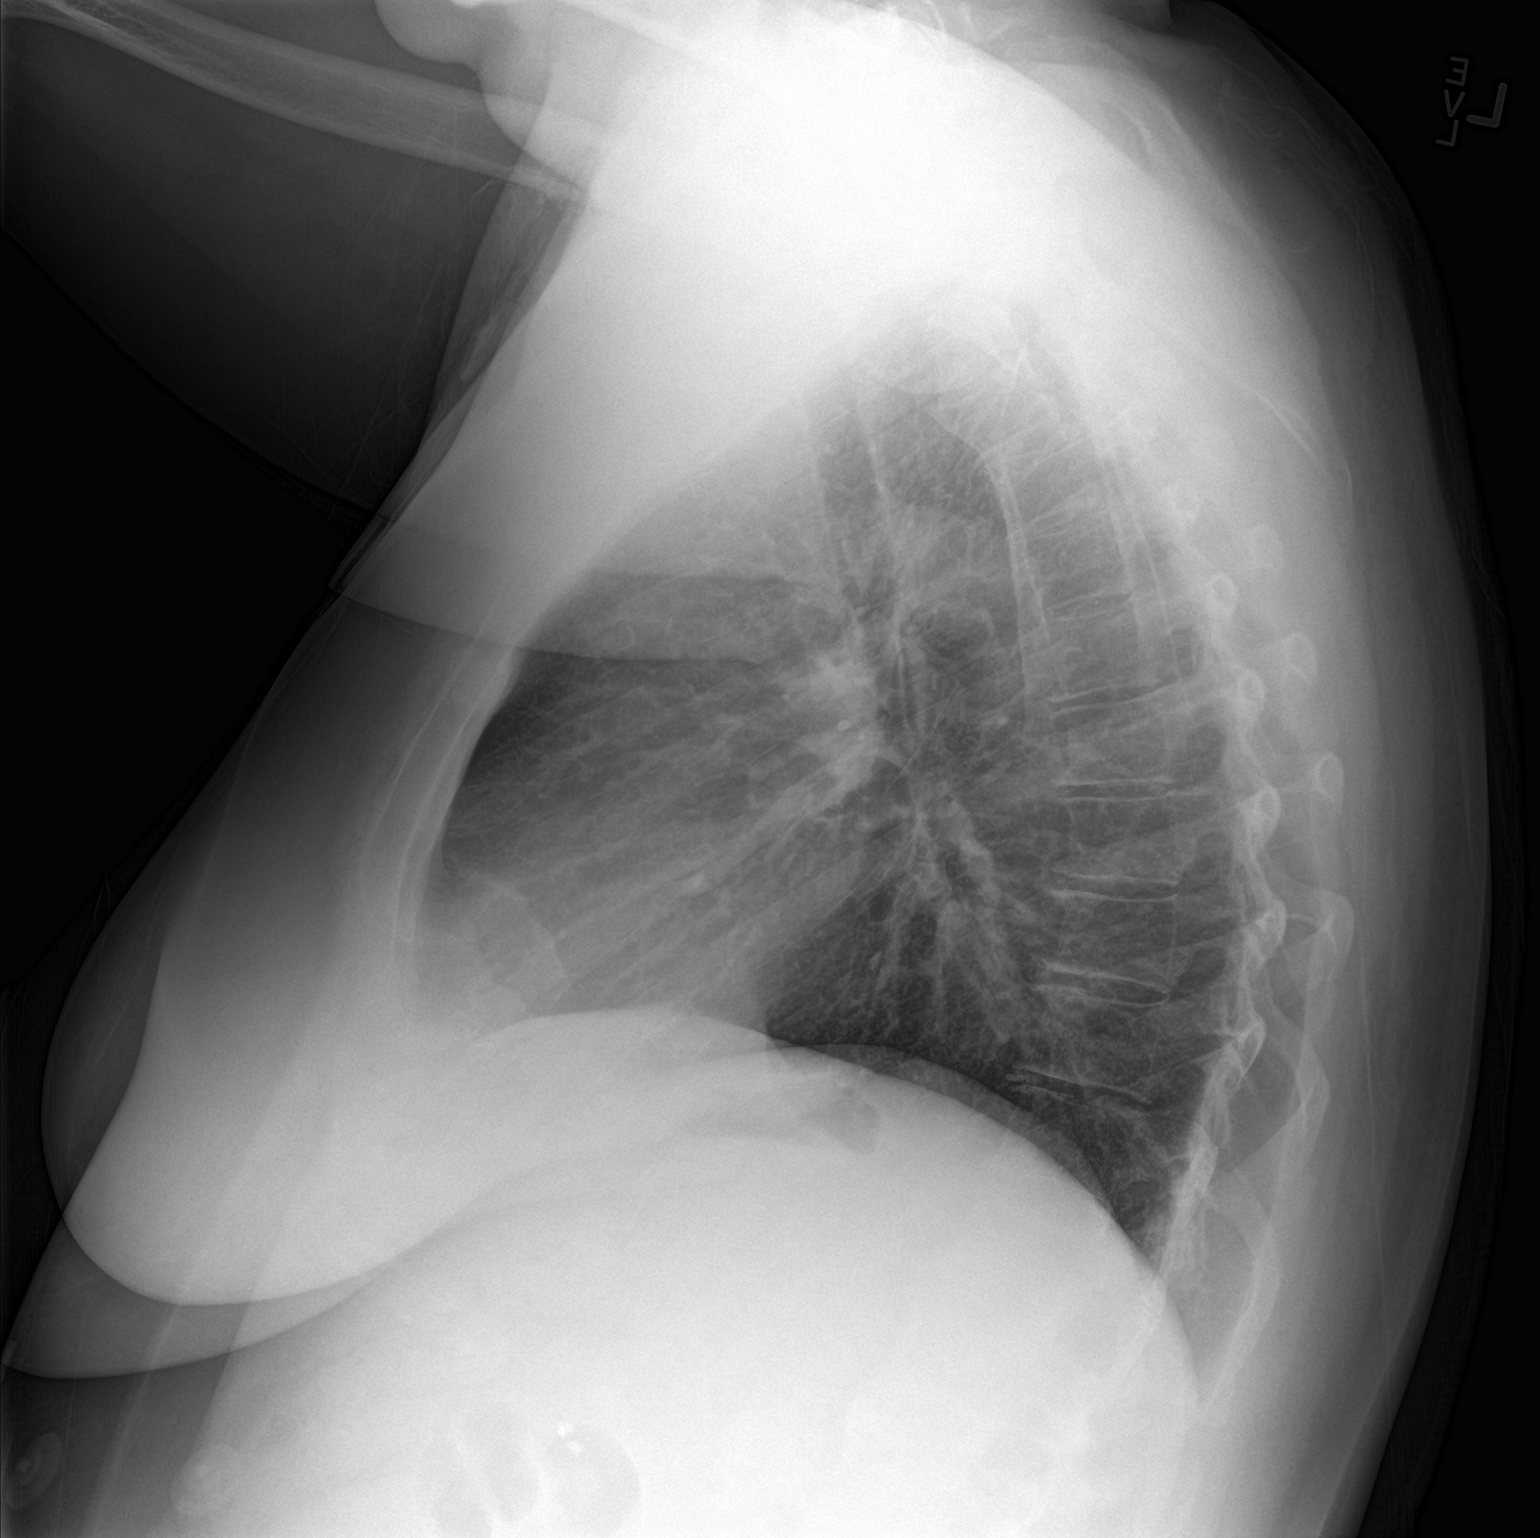

[2 of 2 positions shown; findings below may reference images not displayed]

FINDINGS: No acute airspace disease or pleural effusion. Normal heart size. No
pneumothorax.
IMPRESSION: No active cardiopulmonary disease.

## 2022-06-17 ENCOUNTER — Other Ambulatory Visit: Payer: Self-pay | Admitting: Family Medicine

## 2022-06-17 DIAGNOSIS — Z1231 Encounter for screening mammogram for malignant neoplasm of breast: Secondary | ICD-10-CM

## 2023-02-20 ENCOUNTER — Ambulatory Visit (HOSPITAL_COMMUNITY)
Admission: EM | Admit: 2023-02-20 | Discharge: 2023-02-20 | Disposition: A | Discharge status: Home / Self Care | Payer: 59 | Attending: Internal Medicine | Admitting: Internal Medicine

## 2023-02-20 ENCOUNTER — Encounter (HOSPITAL_COMMUNITY): Payer: Self-pay

## 2023-02-20 DIAGNOSIS — L03119 Cellulitis of unspecified part of limb: Secondary | ICD-10-CM

## 2023-02-20 DIAGNOSIS — L237 Allergic contact dermatitis due to plants, except food: Secondary | ICD-10-CM

## 2023-02-20 MED ORDER — CEPHALEXIN 500 MG PO CAPS: 500.0000 mg | ORAL_CAPSULE | Freq: Three times a day (TID) | ORAL | 0 refills | Status: AC

## 2023-02-20 MED ORDER — PREDNISONE 10 MG (21) PO TBPK: ORAL_TABLET | Freq: Every day | ORAL | 0 refills | Status: AC

## 2023-02-20 MED ORDER — HYDROXYZINE HCL 25 MG PO TABS: 25.0000 mg | ORAL_TABLET | Freq: Three times a day (TID) | ORAL | 0 refills | Status: AC | PRN

## 2023-02-20 NOTE — ED Triage Notes (Signed)
Pt states has poison ivy on her arms since last Sunday. States went to her PCP on Tuesday and was given prednisone. States now the rash is worse and spread to back and inner thighs. States has tried multiple OTC creams with no relief.

## 2023-02-20 NOTE — ED Provider Notes (Signed)
[facility]    CSN: [id number] Arrival date & time: 02/20/23  7829      History   Chief Complaint Chief Complaint  Patient presents with   Rash    HPI Tamara Hodge is a 52 y.o. female comes to the urgent care with 1-1/2 weeks history of rash both upper extremities.  Patient came into contact with poison ivy and developed a rash.  She has completed a course of prednisone with no improvement in his symptoms.  The rash is spread from both upper extremities to the torso.  The rash on the upper extremities are weeping and has become warm to touch and painful.  No fever or chills.  HPI  Past Medical History:  Diagnosis Date   Diabetes mellitus without complication (HCC)     There are no problems to display for this patient.   Past Surgical History:  Procedure Laterality Date   CHOLECYSTECTOMY     OPEN REDUCTION INTERNAL FIXATION (ORIF) DISTAL RADIAL FRACTURE Left 06/10/2014   Procedure: OPEN REDUCTION INTERNAL FIXATION (ORIF) LEFT DISTAL RADIAL FRACTURE;  Surgeon: Betha Loa, MD;  Location: [facility];  Service: Orthopedics;  Laterality: Left;   OVARIAN CYST SURGERY      OB History   No obstetric history on file.      Home Medications    Prior to Admission medications   Medication Sig Start Date End Date Taking? Authorizing Provider  cephALEXin (KEFLEX) 500 MG capsule Take 1 capsule (500 mg total) by mouth 3 (three) times daily for 7 days. 02/20/23 02/27/23 Yes Dashanti Burr, Britta Mccreedy, MD  hydrOXYzine (ATARAX) 25 MG tablet Take 1 tablet (25 mg total) by mouth every 8 (eight) hours as needed. 02/20/23  Yes Emmagene Ortner, Britta Mccreedy, MD  losartan (COZAAR) 100 MG tablet Take 100 mg by mouth daily. 02/15/23  Yes [provider]  metFORMIN (GLUCOPHAGE-XR) 500 MG 24 hr tablet Take 1,000 mg by mouth 2 (two) times daily. 11/07/22  Yes [provider]  predniSONE (STERAPRED UNI-PAK 21 TAB) 10 MG (21) TBPK tablet Take by mouth daily. Take 6 tabs by mouth  daily  for 2 days, then 5 tabs for 2 days, then 4 tabs for 2 days, then 3 tabs for 2 days, 2 tabs for 2 days, then 1 tab by mouth daily for 2 days 02/20/23  Yes Jaxsyn Catalfamo, Britta Mccreedy, MD    Family History History reviewed. No pertinent family history.  Social History Social History   Tobacco Use   Smoking status: Never   Smokeless tobacco: Never  Substance Use Topics   Alcohol use: Yes   Drug use: No     Allergies   Patient has no known allergies.   Review of Systems Review of Systems As per HPI  Physical Exam Triage Vital Signs ED Triage Vitals [02/20/23 1051]  Enc Vitals Group     BP 127/87     Pulse Rate 73     Resp 18     Temp 97.7 F (36.5 C)     Temp Source Oral     SpO2 95 %     Weight      Height      Head Circumference      Peak Flow      Pain Score 2     Pain Loc      Pain Edu?      Excl. in GC?    No data found.  Updated Vital Signs BP 127/87 (BP  Location: Left Arm)   Pulse 73   Temp 97.7 F (36.5 C) (Oral)   Resp 18   SpO2 95%   Visual Acuity Right Eye Distance:   Left Eye Distance:   Bilateral Distance:    Right Eye Near:   Left Eye Near:    Bilateral Near:     Physical Exam Vitals and nursing note reviewed.  Constitutional:      General: She is in acute distress.  Cardiovascular:     Rate and Rhythm: Normal rate and regular rhythm.     Pulses: Normal pulses.     Heart sounds: Normal heart sounds.  Pulmonary:     Effort: Pulmonary effort is normal.     Breath sounds: Normal breath sounds.  Skin:    General: Skin is warm.     Comments: Erythematous rash over both upper extremities.  Rash on the right is erythematous, warm to touch and has an open wound of the upper extremity wounds are also erythematous.  Right lower quadrant abdomen has multiple linear vesicular rash.  Neurological:     Mental Status: She is alert.      UC Treatments / Results  Labs (all labs ordered are listed, but only abnormal results are  displayed) Labs Reviewed - No data to display  EKG   Radiology No results found.  Procedures Procedures (including critical care time)  Medications Ordered in UC Medications - No data to display  Initial Impression / Assessment and Plan / UC Course  I have reviewed the triage vital signs and the nursing notes.  Pertinent labs & imaging results that were available during my care of the patient were reviewed by me and considered in my medical decision making (see chart for details).     1.  Cellulitis of multiple sites on the upper arm: Keflex 3 times daily 7 days Hydroxyzine as needed for itching Please take Tylenol or ibuprofen as needed for  2.Rhus dermatitis: Tapering dose of prednisone Calamine lotion to be applied to the areas he will follow-up Hydroxyzine as needed for itching Return precautions given.  Final Clinical Impressions(s) / UC Diagnoses   Final diagnoses:  Poison ivy dermatitis  Cellulitis of multiple sites of upper arm     Discharge Instructions      Please apply calamine lotion to the areas involved Take medications as prescribed I suspect that the areas involved is getting infected. Please take Tylenol as needed for pain Please return to urgent care if symptoms worsen.   ED Prescriptions     Medication Sig Dispense Auth. Provider   predniSONE (STERAPRED UNI-PAK 21 TAB) 10 MG (21) TBPK tablet Take by mouth daily. Take 6 tabs by mouth daily  for 2 days, then 5 tabs for 2 days, then 4 tabs for 2 days, then 3 tabs for 2 days, 2 tabs for 2 days, then 1 tab by mouth daily for 2 days 42 tablet Donni Oglesby, Britta Mccreedy, MD   hydrOXYzine (ATARAX) 25 MG tablet Take 1 tablet (25 mg total) by mouth every 8 (eight) hours as needed. 21 tablet Marquie Aderhold, Britta Mccreedy, MD   cephALEXin (KEFLEX) 500 MG capsule Take 1 capsule (500 mg total) by mouth 3 (three) times daily for 7 days. 21 capsule Thelton Graca, Britta Mccreedy, MD      PDMP not reviewed this encounter.   Merrilee Jansky, MD 02/20/23 2233

## 2023-02-20 NOTE — Discharge Instructions (Addendum)
Please apply calamine lotion to the areas involved Take medications as prescribed I suspect that the areas involved is getting infected. Please take Tylenol as needed for pain Please return to urgent care if symptoms worsen.
